# Patient Record
Sex: Female | Born: 1955 | Race: White | Hispanic: No | Smoking: Former smoker
Health system: Southern US, Community
[De-identification: ages and names within clinical notes are randomized; demographics above are authoritative.]

## PROBLEM LIST (undated history)

## (undated) DIAGNOSIS — M542 Cervicalgia: Secondary | ICD-10-CM

## (undated) DIAGNOSIS — J449 Chronic obstructive pulmonary disease, unspecified: Secondary | ICD-10-CM

## (undated) DIAGNOSIS — G8929 Other chronic pain: Secondary | ICD-10-CM

## (undated) DIAGNOSIS — E785 Hyperlipidemia, unspecified: Secondary | ICD-10-CM

## (undated) DIAGNOSIS — E079 Disorder of thyroid, unspecified: Secondary | ICD-10-CM

## (undated) HISTORY — PX: TUBAL LIGATION: SHX77

## (undated) HISTORY — PX: OTHER SURGICAL HISTORY: SHX169

## (undated) HISTORY — PX: BUNIONECTOMY: SHX129

---

## 2001-02-09 ENCOUNTER — Other Ambulatory Visit: Admission: RE | Admit: 2001-02-09 | Discharge: 2001-02-09 | Payer: Self-pay | Admitting: Family Medicine

## 2002-06-13 ENCOUNTER — Other Ambulatory Visit: Admission: RE | Admit: 2002-06-13 | Discharge: 2002-06-13 | Payer: Self-pay | Admitting: Family Medicine

## 2005-07-02 ENCOUNTER — Ambulatory Visit: Payer: Self-pay | Admitting: Family Medicine

## 2006-02-19 ENCOUNTER — Ambulatory Visit: Payer: Self-pay | Admitting: Family Medicine

## 2016-07-06 ENCOUNTER — Emergency Department (HOSPITAL_COMMUNITY): Payer: BLUE CROSS/BLUE SHIELD

## 2016-07-06 ENCOUNTER — Encounter (HOSPITAL_COMMUNITY): Payer: Self-pay

## 2016-07-06 ENCOUNTER — Observation Stay (HOSPITAL_COMMUNITY)
Admission: EM | Admit: 2016-07-06 | Discharge: 2016-07-07 | Disposition: A | Discharge status: Home / Self Care | Payer: BLUE CROSS/BLUE SHIELD | Attending: Internal Medicine | Admitting: Internal Medicine

## 2016-07-06 DIAGNOSIS — G459 Transient cerebral ischemic attack, unspecified: Secondary | ICD-10-CM | POA: Diagnosis not present

## 2016-07-06 DIAGNOSIS — Z7982 Long term (current) use of aspirin: Secondary | ICD-10-CM | POA: Diagnosis not present

## 2016-07-06 DIAGNOSIS — H5461 Unqualified visual loss, right eye, normal vision left eye: Secondary | ICD-10-CM | POA: Diagnosis present

## 2016-07-06 DIAGNOSIS — E039 Hypothyroidism, unspecified: Secondary | ICD-10-CM | POA: Insufficient documentation

## 2016-07-06 DIAGNOSIS — Z833 Family history of diabetes mellitus: Secondary | ICD-10-CM | POA: Insufficient documentation

## 2016-07-06 DIAGNOSIS — F17210 Nicotine dependence, cigarettes, uncomplicated: Secondary | ICD-10-CM | POA: Diagnosis not present

## 2016-07-06 DIAGNOSIS — E038 Other specified hypothyroidism: Secondary | ICD-10-CM | POA: Diagnosis not present

## 2016-07-06 DIAGNOSIS — J449 Chronic obstructive pulmonary disease, unspecified: Secondary | ICD-10-CM | POA: Diagnosis not present

## 2016-07-06 DIAGNOSIS — H5441 Blindness, right eye, normal vision left eye: Secondary | ICD-10-CM | POA: Insufficient documentation

## 2016-07-06 DIAGNOSIS — J438 Other emphysema: Secondary | ICD-10-CM | POA: Diagnosis not present

## 2016-07-06 DIAGNOSIS — Z823 Family history of stroke: Secondary | ICD-10-CM | POA: Diagnosis not present

## 2016-07-06 DIAGNOSIS — G453 Amaurosis fugax: Secondary | ICD-10-CM | POA: Diagnosis not present

## 2016-07-06 DIAGNOSIS — E785 Hyperlipidemia, unspecified: Secondary | ICD-10-CM | POA: Insufficient documentation

## 2016-07-06 HISTORY — DX: Cervicalgia: M54.2

## 2016-07-06 HISTORY — DX: Hyperlipidemia, unspecified: E78.5

## 2016-07-06 HISTORY — DX: Chronic obstructive pulmonary disease, unspecified: J44.9

## 2016-07-06 HISTORY — DX: Disorder of thyroid, unspecified: E07.9

## 2016-07-06 HISTORY — DX: Other chronic pain: G89.29

## 2016-07-06 LAB — CBC WITH DIFFERENTIAL/PLATELET
Basophils Absolute: 0 10*3/uL (ref 0.0–0.1)
Basophils Relative: 1 %
Eosinophils Absolute: 0.2 10*3/uL (ref 0.0–0.7)
Eosinophils Relative: 2 %
HCT: 46.3 % — ABNORMAL HIGH (ref 36.0–46.0)
Hemoglobin: 15 g/dL (ref 12.0–15.0)
Lymphocytes Relative: 28 %
Lymphs Abs: 1.9 10*3/uL (ref 0.7–4.0)
MCH: 31 pg (ref 26.0–34.0)
MCHC: 32.4 g/dL (ref 30.0–36.0)
MCV: 95.7 fL (ref 78.0–100.0)
Monocytes Absolute: 0.6 10*3/uL (ref 0.1–1.0)
Monocytes Relative: 8 %
Neutro Abs: 4 10*3/uL (ref 1.7–7.7)
Neutrophils Relative %: 61 %
Platelets: 277 10*3/uL (ref 150–400)
RBC: 4.84 MIL/uL (ref 3.87–5.11)
RDW: 12.7 % (ref 11.5–15.5)
WBC: 6.6 10*3/uL (ref 4.0–10.5)

## 2016-07-06 LAB — CBG MONITORING, ED: GLUCOSE-CAPILLARY: 125 mg/dL — AB (ref 65–99)

## 2016-07-06 LAB — BASIC METABOLIC PANEL
Anion gap: 8 (ref 5–15)
BUN: 7 mg/dL (ref 6–20)
CO2: 27 mmol/L (ref 22–32)
Calcium: 9.3 mg/dL (ref 8.9–10.3)
Chloride: 107 mmol/L (ref 101–111)
Creatinine, Ser: 0.58 mg/dL (ref 0.44–1.00)
GFR calc Af Amer: >60 mL/min (ref >60)
GFR calc non Af Amer: >60 mL/min (ref >60)
Glucose, Bld: 78 mg/dL (ref 65–99)
Potassium: 3.4 mmol/L — ABNORMAL LOW (ref 3.5–5.1)
Sodium: 142 mmol/L (ref 135–145)

## 2016-07-06 LAB — URINALYSIS, ROUTINE W REFLEX MICROSCOPIC
Bilirubin Urine: NEGATIVE
Glucose, UA: NEGATIVE mg/dL
Hgb urine dipstick: NEGATIVE
Ketones, ur: NEGATIVE mg/dL
Leukocytes, UA: NEGATIVE
Nitrite: NEGATIVE
Protein, ur: NEGATIVE mg/dL
Specific Gravity, Urine: 1.016 (ref 1.005–1.030)
pH: 6.5 (ref 5.0–8.0)

## 2016-07-06 MED ORDER — ONDANSETRON HCL 4 MG/2ML IJ SOLN: 4.0000 mg | Freq: Three times a day (TID) | INTRAMUSCULAR | Status: AC | PRN

## 2016-07-06 MED ORDER — MOMETASONE FURO-FORMOTEROL FUM 200-5 MCG/ACT IN AERO
2.0000 | INHALATION_SPRAY | Freq: Two times a day (BID) | RESPIRATORY_TRACT | Status: DC
  Administered 2016-07-07: 2 via RESPIRATORY_TRACT
  Filled 2016-07-06: qty 8.8

## 2016-07-06 MED ORDER — LORAZEPAM 2 MG/ML IJ SOLN
INTRAMUSCULAR | Status: AC
Start: 2016-07-06 — End: 2016-07-06
  Administered 2016-07-06: 1 mg
  Filled 2016-07-06: qty 1

## 2016-07-06 MED ORDER — SODIUM CHLORIDE 0.9% FLUSH
3.0000 mL | Freq: Two times a day (BID) | INTRAVENOUS | Status: DC
  Administered 2016-07-06: 3 mL via INTRAVENOUS

## 2016-07-06 MED ORDER — STROKE: EARLY STAGES OF RECOVERY BOOK
Freq: Once | Status: AC
  Administered 2016-07-06: 22:00:00

## 2016-07-06 MED ORDER — ENOXAPARIN SODIUM 40 MG/0.4ML ~~LOC~~ SOLN
40.0000 mg | Freq: Every day | SUBCUTANEOUS | Status: DC
  Administered 2016-07-06: 40 mg via SUBCUTANEOUS
  Filled 2016-07-06: qty 0.4

## 2016-07-06 MED ORDER — GADOBENATE DIMEGLUMINE 529 MG/ML IV SOLN
12.0000 mL | Freq: Once | INTRAVENOUS | Status: AC | PRN
  Administered 2016-07-06: 12 mL via INTRAVENOUS

## 2016-07-06 NOTE — ED Notes (Addendum)
Pt. Returned from MRI. Admitting provider at bedside.

## 2016-07-06 NOTE — ED Provider Notes (Signed)
[facility]-EMERGENCY DEPT Provider Note   CSN: [id number] Arrival date & time: 07/06/16  1305     History   Chief Complaint Chief Complaint  Patient presents with  . Neurologic Problem    HPI Nicole Hayes is a 60 y.o. female with history of COPD and 80-pack-year smoking history who presents following an episode of transient vision loss in her right eye. Patient states she was driving to a dental appointment this morning for extraction when she began seeing a curtain over her right eye. Patient states she began massaging her eye to try and improve her symptoms and she lost total vision in her right eye for 3-5 minutes. Around this time she also had a vague headache all over, which resolved. Patient arrived to her dentist appointment and her blood pressure was elevated to 143 systolic, which the patient's reports is very high for her. Prior to entering the dentist office, the patient took an aspirin and Valium prior to her anticipated dental extraction. Patient also reports she has been feeling off balance and lightheaded intermittently for the past few days. Patient states this has happened to her in the past when her fibroid medication was off. Patient also reports a "sandy," try feeling to her eyes for the past few days. Patient has been using drops OTC for her dry eyes. Patient also reports a bad episode of leg cramps last evening, however she experiences these on a regular basis. Patient believes it is due to her that his hearing diet and nutrient deficiency. Patient denies any chest pain, shortness of breath, abdominal pain, nausea, vomiting, dysuria, headache, dizziness, or lightheadedness at this time.  HPI  Past Medical History:  Diagnosis Date  . Chronic neck pain   . COPD (chronic obstructive pulmonary disease) (HCC)   . Hyperlipidemia   . Thyroid disease     Patient Active Problem List   Diagnosis Date Noted  . TIA (transient ischemic attack) 07/06/2016  . Vision loss of right  eye 07/06/2016    Past Surgical History:  Procedure Laterality Date  . BUNIONECTOMY     x2  . dental     implants  . TUBAL LIGATION      OB History    Gravida Para Term Preterm AB Living   4         3   SAB TAB Ectopic Multiple Live Births                   Home Medications    Prior to Admission medications   Medication Sig Start Date End Date Taking? Authorizing Provider  Fluticasone-Salmeterol (ADVAIR) 250-50 MCG/DOSE AEPB Inhale 1 puff into the lungs 2 (two) times daily.   Yes Historical Provider, MD  Multiple Vitamins-Minerals (ALIVE ONCE DAILY WOMENS PO) Take 1 tablet by mouth daily.   Yes Historical Provider, MD    Family History History reviewed. No pertinent family history.  Social History Social History  Substance Use Topics  . Smoking status: Former Smoker    Packs/day: 2.00    Years: 40.00    Types: Cigarettes  . Smokeless tobacco: Never Used     Comment: 2009 quit  . Alcohol use No     Allergies   Vitamin d2 [ergocalciferol] and Metformin and related   Review of Systems Review of Systems  Constitutional: Negative for chills and fever.  HENT: Negative for facial swelling.   Eyes: Positive for visual disturbance (resolved).  Respiratory: Negative for shortness of breath.  Cardiovascular: Negative for chest pain.  Gastrointestinal: Negative for abdominal pain, nausea and vomiting.  Genitourinary: Negative for dysuria.  Musculoskeletal: Negative for gait problem.  Skin: Negative for rash and wound.  Neurological: Positive for headaches (resolved).  Psychiatric/Behavioral: The patient is not nervous/anxious.      Physical Exam Updated Vital Signs BP 108/59 (BP Location: Left Arm)   Pulse 71   Temp 97.7 F (36.5 C)   Resp 17   SpO2 95%   Physical Exam  Constitutional: She appears well-developed and well-nourished. No distress.  HENT:  Head: Normocephalic and atraumatic.  Mouth/Throat: Oropharynx is clear and moist. No oropharyngeal  exudate.  Eyes: Conjunctivae and EOM are normal. Pupils are equal, round, and reactive to light. Right eye exhibits no discharge. Left eye exhibits no discharge. No scleral icterus.  Neck: Normal range of motion. Neck supple. No thyromegaly present.  Cardiovascular: Normal rate, regular rhythm and normal heart sounds.  Exam reveals no gallop and no friction rub.   No murmur heard. Pulmonary/Chest: Effort normal and breath sounds normal. No stridor. No respiratory distress. She has no wheezes. She has no rales.  Abdominal: Soft. Bowel sounds are normal. She exhibits no distension. There is no tenderness. There is no rebound and no guarding.  Musculoskeletal: She exhibits no edema.  Lymphadenopathy:    She has no cervical adenopathy.  Neurological: She is alert. Coordination normal.  CN 3-12 intact; normal sensation throughout; 5/5 strength in all 4 extremities; equal bilateral grip strength; no ataxia on finger to nose; no pronator drift   Skin: Skin is warm and dry. No rash noted. She is not diaphoretic. No pallor.  Psychiatric: She has a normal mood and affect.  Nursing note and vitals reviewed.    ED Treatments / Results  Labs (all labs ordered are listed, but only abnormal results are displayed) Labs Reviewed  BASIC METABOLIC PANEL - Abnormal; Notable for the following:       Result Value   Potassium 3.4 (*)    All other components within normal limits  CBC WITH DIFFERENTIAL/PLATELET - Abnormal; Notable for the following:    HCT 46.3 (*)    All other components within normal limits  CBG MONITORING, ED - Abnormal; Notable for the following:    Glucose-Capillary 125 (*)    All other components within normal limits  URINALYSIS, ROUTINE W REFLEX MICROSCOPIC (NOT [facility])    EKG  EKG Interpretation  Date/Time:  Monday July 06 2016 13:11:47 EDT Ventricular Rate:  76 PR Interval:    QRS Duration: 93 QT Interval:  390 QTC Calculation: 439 R Axis:   96 Text Interpretation:   Sinus rhythm Right axis deviation Borderline T abnormalities, inferior leads Confirmed by Lincoln Brighamees, Liz [phone]([id number]) on 07/06/2016 1:50:10 PM       Radiology No results found.  Procedures Procedures (including critical care time)  Medications Ordered in ED Medications  LORazepam (ATIVAN) 2 MG/ML injection (1 mg  Given 07/06/16 1737)  gadobenate dimeglumine (MULTIHANCE) injection 12 mL (12 mLs Intravenous Contrast Given 07/06/16 1916)     Initial Impression / Assessment and Plan / ED Course  I have reviewed the triage vital signs and the nursing notes.  Pertinent labs & imaging results that were available during my care of the patient were reviewed by me and considered in my medical decision making (see chart for details).  Clinical Course   CBC unremarkable. BMP shows potassium 3.4. UA negative. CBG 125. MR brain and MRA head and neck pending.  Patient evaluated by Felicie Morn, PA-C with neurology who would like a hospitalist admission for a stroke workup because of the patient's amaurosis fugax and TIA. Dr. Madilyn Hook spoke with Dr. Roda Shutters who will admit the patient to internal medicine for further evaluation and treatment. Patient also evaluated by Dr. Madilyn Hook who guided the patient's management and is in agreement with plan. Patient vitals stable throughout ED course.   Final Clinical Impressions(s) / ED Diagnoses   Final diagnoses:  Amaurosis fugax of right eye    New Prescriptions New Prescriptions   No medications on file     Verdis Prime 07/06/16 1919    Tilden Fossa, MD 07/08/16 1336

## 2016-07-06 NOTE — ED Notes (Signed)
Ordered diet tray(Reg Vegetarian)

## 2016-07-06 NOTE — ED Notes (Signed)
Gave pt coffee, crackers and peanut butter, per Erin-RN.

## 2016-07-06 NOTE — ED Notes (Signed)
Neuro at bedside.

## 2016-07-06 NOTE — Consult Note (Signed)
Requesting Physician: Dr. Pecola Leisure    Chief Complaint: amaurosis fugax  History obtained from:  Patient     HPI:                                                                                                                                         Nicole Hayes is an 60 y.o. female who quit smoking 6 years ago and has known hyperlipidemia. Patient states that she does not take aspirin although she does have aspirin interpersonal times. 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 She had no pain in that eye. She states she drove about 1-2 miles before the shade lifted. She denies any numbness, tingling, weakness throughout her face or other extremities. He denies any palpitations shortness of breath or sensation of dizziness. Currently her blood pressure is 98/83 however she states this is normal for her. Currently she is awaiting MRI and MRA of head and neck.  Date last known well: Date: 07/06/2016 Time last known well: Time: 09:00 tPA Given: No: symptoms resolved  Past Medical History:  Diagnosis Date  . Chronic neck pain   . COPD (chronic obstructive pulmonary disease) (HCC)   . Hyperlipidemia   . Thyroid disease     Past Surgical History:  Procedure Laterality Date  . BUNIONECTOMY     x2  . dental     implants  . TUBAL LIGATION      History reviewed. No pertinent family history. Social History:  reports that she has quit smoking. Her smoking use included Cigarettes. She has a 80.00 pack-year smoking history. She has never used smokeless tobacco. She reports that she does not drink alcohol or use drugs.  Allergies:  Allergies  Allergen Reactions  . Vitamin D2 [Ergocalciferol] Hives and Shortness Of Breath  . Metformin And Related Nausea And Vomiting    Medications:                                                                                                                            No current facility-administered medications for this encounter.    No current outpatient prescriptions on file.     ROS:  History obtained from the patient  General ROS: negative for - chills, fatigue, fever, night sweats, weight gain or weight loss Psychological ROS: negative for - behavioral disorder, hallucinations, memory difficulties, mood swings or suicidal ideation Ophthalmic ROS: negative for - blurry vision, double vision, eye pain or loss of vision ENT ROS: negative for - epistaxis, nasal discharge, oral lesions, sore throat, tinnitus or vertigo Allergy and Immunology ROS: negative for - hives or itchy/watery eyes Hematological and Lymphatic ROS: negative for - bleeding problems, bruising or swollen lymph nodes Endocrine ROS: negative for - galactorrhea, hair pattern changes, polydipsia/polyuria or temperature intolerance Respiratory ROS: negative for - cough, hemoptysis, shortness of breath or wheezing Cardiovascular ROS: negative for - chest pain, dyspnea on exertion, edema or irregular heartbeat Gastrointestinal ROS: negative for - abdominal pain, diarrhea, hematemesis, nausea/vomiting or stool incontinence Genito-Urinary ROS: negative for - dysuria, hematuria, incontinence or urinary frequency/urgency Musculoskeletal ROS: negative for - joint swelling or muscular weakness Neurological ROS: as noted in HPI Dermatological ROS: negative for rash and skin lesion changes  Neurologic Examination:                                                                                                      Blood pressure 98/83, pulse 78, temperature 97.7 F (36.5 C), resp. rate 24, SpO2 96 %.  HEENT-  Normocephalic, no lesions, without obvious abnormality.  Normal external eye and conjunctiva.   Normal TM's bilaterally.  Normal auditory canals and external ears. Normal external nose, mucus membranes and septum.  Normal pharynx. Cardiovascular- S1, S2 normal, pulses palpable throughout   Lungs- chest clear, no wheezing, rales, normal symmetric air entry Abdomen- normal findings: bowel sounds normal Extremities- no edema Lymph-no adenopathy palpable Musculoskeletal-no joint tenderness, deformity or swelling Skin-warm and dry, no hyperpigmentation, vitiligo, or suspicious lesions  Neurological Examination Mental Status: Alert, oriented, thought content appropriate.  Speech fluent without evidence of aphasia.  Able to follow 3 step commands without difficulty. Cranial Nerves: II: Visual fields grossly normal, pupils equal, round, reactive to light and accommodation, no ADP noted III,IV, VI: ptosis not present, extra-ocular motions intact bilaterally without nystagmus V,VII: smile symmetric, facial light touch sensation normal bilaterally VIII: hearing normal bilaterally IX,X: uvula rises symmetrically XI: bilateral shoulder shrug XII: midline tongue extension Motor: Right : Upper extremity   5/5    Left:     Upper extremity   5/5  Lower extremity   5/5     Lower extremity   5/5 Tone and bulk:normal tone throughout; no atrophy noted Sensory: Pinprick and light touch intact throughout, bilaterally Deep Tendon Reflexes: 2+ and symmetric throughout Plantars: Right: downgoing   Left: downgoing Cerebellar: normal finger-to-nose,  and normal heel-to-shin test Gait: Not tested       Lab Results: Basic Metabolic Panel:  Recent Labs Lab 07/06/16 1518  NA 142  K 3.4*  CL 107  CO2 27  GLUCOSE 78  BUN 7  CREATININE 0.58  CALCIUM 9.3    Liver Function Tests: No results for input(s): AST, ALT, ALKPHOS, BILITOT, PROT, ALBUMIN in the last 168  hours. No results for input(s): LIPASE, AMYLASE in the last 168 hours. No results for input(s): AMMONIA in the last 168  hours.  CBC:  Recent Labs Lab 07/06/16 1518  WBC 6.6  NEUTROABS 4.0  HGB 15.0  HCT 46.3*  MCV 95.7  PLT 277    Cardiac Enzymes: No results for input(s): CKTOTAL, CKMB, CKMBINDEX, TROPONINI in the last 168 hours.  Lipid Panel: No results for input(s): CHOL, TRIG, HDL, CHOLHDL, VLDL, LDLCALC in the last 168 hours.  CBG:  Recent Labs Lab 07/06/16 1314  GLUCAP 125*    Microbiology: No results found for this or any previous visit.  Coagulation Studies: No results for input(s): LABPROT, INR in the last 72 hours.  Imaging: No results found.     Assessment and plan discussed with with attending physician and they are in agreement.    Nicole Morn PA-C [facility] [phone]  07/06/2016, 4:22 PM   Assessment: 60 y.o. female with history of hyperlipidemia along with recently quitting smoking. Patient presents with transient loss of vision in the right eye very consistent with amaurosis fugax.  Given her risk factors patient would benefit from stroke workup.    Stroke Risk Factors - hyperlipidemia and previous smoker   1. HgbA1c, fasting lipid panel 2. MRI, MRA  of the brain without contrast 3. PT consult, OT consult, Speech consult 4. Echocardiogram 5. Carotid dopplers 6. Prophylactic therapy-Antiplatelet med: Aspirin - dose 81 mg daily 7. Risk factor modification 8. Telemetry monitoring 9. Frequent neuro checks 10 NPO until passes stroke swallow screen 11 please page stroke NP  Or  PA  Or MD from 8am -4 pm  as this patient from this time will be  followed by the stroke.   You can look them up on [email or web address]  [facility]    Neurology Attending Addendum  This patient was independently seen and examined. I have reviewed the PA note and agree with the findings, assessment and plan as documented with the following additions.   In brief, this is a 60 year old right-handed woman who developed transient vision loss in the right eye while driving  earlier today. This lasted 1-2 minutes and then completely resolved. She also reports a mild headache at the same time. She denies any other deficits such as weakness, numbness, double vision, speech changes, difficulty swallowing, vertigo. She has never had vision loss like this before. However, she has noticed for the past few months occasional episodes where she will have sensitivity to lights when driving at night. This did not occur today. She also suffered a recent corneal abrasion in the right eye. She does not take aspirin on a regular basis. She reports a history of hyperlipidemia, no other known risk factors. However, her sister suffered a stroke at the age of 98 or 22. The patient reports that her sister is also diabetic with peripheral arterial disease and longstanding tobacco abuse. There is no family or personal history of abnormal blood clots.  Physical exam: Her elemental neurologic examination is normal.  Diagnostic studies: I have personally and independently reviewed the MRI scan of the brain without contrast from 07/06/16. There are scattered patchy T2/FLAIR hyperintensities in the bihemispheric white matter and the pons that are most consistent with chronic small vessel ischemic change. No restricted diffusion is present to suggest acute ischemia. No other acute normalities are appreciated.  I have personally and independently reviewed the MRA scan of the head. This appears to be normal.  I have personally and independently  reviewed the MRA scan of the neck. She has a dominant right vertebral artery with mild irregularity of both vertebral arteries noted. Per radiologist report, these are suggestive of mild atherosclerotic change versus extrinsic deformity such as that which could be seen with degenerative cervical spine disease.   Impression:  1. Possible TIA: History is reported is concerning for amaurosis fugax, consistent with transient retinal ischemia. No pathology is seen in  the carotid arteries or intracranial vessels. Her only known risk factors for cerebrovascular disease are hyperlipidemia and an early family history of stroke in her sister. Further workup is pending including echocardiogram, hemoglobin A1c, and a fasting lipid panel. Recommend aspirin 81 mg daily for secondary prevention. She will likely also benefit from the addition of a statin once fasting lipids have been evaluated.  This was discussed with the patient and she is in agreement with the plan as stated. She was given the opportunity to ask any questions and these are addressed to her satisfaction. Thank you for the opportunity of this patient's patient's care. Please for free to call any questions or concerns.  The patient will be followed by the stroke team evening with morning rounds on 07/07/16.

## 2016-07-06 NOTE — ED Triage Notes (Signed)
Pt. Coming from urgent care via [facility] for an episode of vision loss lasting 3-5 min in right eye. Pt. Driving to dentist for tooth extraction during this episode. Pt. Arrived at dentist, tooth valium and 324 ASA, and got her blood pressure checked. Pt. Reports BP systolic 140 and that is highly abnormal for her. Pt. Reports usual is 100 systolic. Pt. Hx of high cholesterol, thyroid disease, and COPD. Pt. Aox4 with no complaints at this time.

## 2016-07-06 NOTE — H&P (Signed)
History and Physical  Nicole BurnCosby J Mifflin ZOX:[id number] DOB: Aug 08, 1956 DOA: 07/06/2016  Referring physician: EDP PCP: Pcp Not In System   Chief Complaint:  Right eye Loss of vision   HPI: Nicole Hayes is a 60 y.o. female  With h/ o high cholesterol, thyroid disease, and COPD, current Smoker who does not take any medicine on regular basis including asa. Today she was driving to her dentist for tooth extraction, she developed vision loss to her right eye which she described as there was a veil coming down over her right eye leading to complete blindness in the right eye, symptom lasted about 3-5 mins, she also report some vage headache with the vision changes, but no eye pain, she has been feeling off balance and lightheadedness intermittently for the pastfew days, she has chronic intermittent leg cramps, she took valium and 324 asa,EMS called,  systolic blood pressure around 140, [facility].   ED course: her vital is stable, she is asymptomatic upon arrival, basic labs with normal wbc, mildly elevated hgb/hct at 15/46%, k 3.4, cr 0.58, ua unremarkable, ekg with NSR, right axis deviation,no acute st/t changes,  QTc wnl, neurology consulted who recommended stroke work up and admit to hospitalist service  She has no fever, no chest pain, no cough, no sob, no palpitation, no constipation, no diarrhea, no dysuria, no lower extremity edem   Review of Systems:  Detail per HPI, Review of systems are otherwise negative  Past Medical History:  Diagnosis Date  . Chronic neck pain   . COPD (chronic obstructive pulmonary disease) (HCC)   . Hyperlipidemia   . Thyroid disease    Past Surgical History:  Procedure Laterality Date  . BUNIONECTOMY     x2  . dental     implants  . TUBAL LIGATION     Social History:  reports that she has quit smoking. Her smoking use included Cigarettes. She has a 80.00 pack-year smoking history. She has never used smokeless tobacco. She reports  that she does not drink alcohol or use drugs. Patient lives at home & is able to participate in activities of daily living independently   Allergies  Allergen Reactions  . Vitamin D2 [Ergocalciferol] Hives and Shortness Of Breath  . Metformin And Related Nausea And Vomiting    History reviewed. No pertinent family history.    Prior to Admission medications   Not on File    Physical Exam: BP 108/59 (BP Location: Left Arm)   Pulse 71   Temp 97.7 F (36.5 C)   Resp 17   SpO2 95%   General:  NAD Eyes: PERRL ENT: unremarkable Neck: supple, no JVD Cardiovascular: RRR Respiratory: CTABL Abdomen: soft/ND/ND, positive bowel sounds Skin: no rash Musculoskeletal:  No edema Psychiatric: calm/cooperative Neurologic: no focal findings            Labs on Admission:  Basic Metabolic Panel:  Recent Labs Lab 07/06/16 1518  NA 142  K 3.4*  CL 107  CO2 27  GLUCOSE 78  BUN 7  CREATININE 0.58  CALCIUM 9.3   Liver Function Tests: No results for input(s): AST, ALT, ALKPHOS, BILITOT, PROT, ALBUMIN in the last 168 hours. No results for input(s): LIPASE, AMYLASE in the last 168 hours. No results for input(s): AMMONIA in the last 168 hours. CBC:  Recent Labs Lab 07/06/16 1518  WBC 6.6  NEUTROABS 4.0  HGB 15.0  HCT 46.3*  MCV 95.7  PLT 277   Cardiac  Enzymes: No results for input(s): CKTOTAL, CKMB, CKMBINDEX, TROPONINI in the last 168 hours.  BNP (last 3 results) No results for input(s): BNP in the last 8760 hours.  ProBNP (last 3 results) No results for input(s): PROBNP in the last 8760 hours.  CBG:  Recent Labs Lab 07/06/16 1314  GLUCAP 125*    Radiological Exams on Admission: No results found.    Assessment/Plan Present on Admission: **None**  Right eye vision loss concerning for amaurosis fugax, stroke/TIA focused order set utilized, neurology consulted stroke work up in progress, will follow neurology rec's. Admit to med tele/obs  Smoker: prior  heavy smoking, report quit in 2009  H/o copd/HLD/thyroid disease: report not taking meds on regular basis, stable, check lipid panel and tsh    DVT prophylaxis: lovenox  Consultants: neurology  Code Status: full   Family Communication:  Patient   Disposition Plan: admit to med tele  Time spent: 60mins  Sorah Falkenstein MD, PhD [facility] Pager [phone] If 7PM-7AM, please contact night-coverage at [email or web address], password [email or web address]

## 2016-07-06 NOTE — ED Notes (Signed)
Pt. Transported to cT at this time.  

## 2016-07-06 NOTE — ED Notes (Signed)
Pt. Sleeping and oxygen saturation dropping to 80's. Pt. Placed on oxygen 2L nasal canula by RN. Pt. Still very sleepy from ativan at this time.

## 2016-07-06 NOTE — ED Notes (Signed)
Patient transported to MRI 

## 2016-07-06 NOTE — ED Notes (Signed)
Pt. passed swallowing screen , NIH score =0 , denies pain / respirations unlabored , iv site intact , no vision loss at this time .

## 2016-07-06 NOTE — ED Notes (Signed)
RN went to MRI to give medication because patient very claustrophobic.

## 2016-07-06 NOTE — ED Notes (Signed)
Pt. Transported to MRI at this time.  

## 2016-07-06 NOTE — ED Notes (Signed)
Pt. Ambulatory from stretcher to bed with steady gait.

## 2016-07-06 NOTE — ED Notes (Signed)
EDP at bedside  

## 2016-07-07 ENCOUNTER — Observation Stay (HOSPITAL_BASED_OUTPATIENT_CLINIC_OR_DEPARTMENT_OTHER): Payer: BLUE CROSS/BLUE SHIELD

## 2016-07-07 DIAGNOSIS — E038 Other specified hypothyroidism: Secondary | ICD-10-CM | POA: Diagnosis not present

## 2016-07-07 DIAGNOSIS — G459 Transient cerebral ischemic attack, unspecified: Secondary | ICD-10-CM | POA: Diagnosis not present

## 2016-07-07 DIAGNOSIS — G453 Amaurosis fugax: Secondary | ICD-10-CM | POA: Diagnosis not present

## 2016-07-07 DIAGNOSIS — E785 Hyperlipidemia, unspecified: Secondary | ICD-10-CM | POA: Diagnosis not present

## 2016-07-07 LAB — RAPID URINE DRUG SCREEN, HOSP PERFORMED
Amphetamines: NOT DETECTED
BARBITURATES: NOT DETECTED
Benzodiazepines: POSITIVE — AB
Cocaine: NOT DETECTED
Opiates: NOT DETECTED
Tetrahydrocannabinol: NOT DETECTED

## 2016-07-07 LAB — COMPREHENSIVE METABOLIC PANEL
ALK PHOS: 58 U/L (ref 38–126)
ALT: 23 U/L (ref 14–54)
AST: 22 U/L (ref 15–41)
Albumin: 4.2 g/dL (ref 3.5–5.0)
Anion gap: 7 (ref 5–15)
BILIRUBIN TOTAL: 0.7 mg/dL (ref 0.3–1.2)
BUN: 7 mg/dL (ref 6–20)
CALCIUM: 9.2 mg/dL (ref 8.9–10.3)
CO2: 26 mmol/L (ref 22–32)
Chloride: 106 mmol/L (ref 101–111)
Creatinine, Ser: 0.67 mg/dL (ref 0.44–1.00)
Glucose, Bld: 146 mg/dL — ABNORMAL HIGH (ref 65–99)
POTASSIUM: 3.7 mmol/L (ref 3.5–5.1)
Sodium: 139 mmol/L (ref 135–145)
TOTAL PROTEIN: 7.1 g/dL (ref 6.5–8.1)

## 2016-07-07 LAB — SEDIMENTATION RATE: SED RATE: 4 mm/h (ref 0–22)

## 2016-07-07 LAB — TSH: TSH: 1.704 u[IU]/mL (ref 0.350–4.500)

## 2016-07-07 LAB — HIV ANTIBODY (ROUTINE TESTING W REFLEX): HIV SCREEN 4TH GENERATION: NONREACTIVE

## 2016-07-07 LAB — CBC
HEMATOCRIT: 48 % — AB (ref 36.0–46.0)
HEMOGLOBIN: 15.7 g/dL — AB (ref 12.0–15.0)
MCH: 31.3 pg (ref 26.0–34.0)
MCHC: 32.7 g/dL (ref 30.0–36.0)
MCV: 95.6 fL (ref 78.0–100.0)
Platelets: 255 10*3/uL (ref 150–400)
RBC: 5.02 MIL/uL (ref 3.87–5.11)
RDW: 12.6 % (ref 11.5–15.5)
WBC: 5.5 10*3/uL (ref 4.0–10.5)

## 2016-07-07 LAB — PROTIME-INR
INR: 1.07
PROTHROMBIN TIME: 14 s (ref 11.4–15.2)

## 2016-07-07 LAB — ECHOCARDIOGRAM COMPLETE
HEIGHTINCHES: 67 in
WEIGHTICAEL: 2000 [oz_av]

## 2016-07-07 LAB — LIPID PANEL
Cholesterol: 237 mg/dL — ABNORMAL HIGH (ref 0–200)
HDL: 71 mg/dL (ref >40)
LDL Cholesterol: 144 mg/dL — ABNORMAL HIGH (ref 0–99)
TRIGLYCERIDES: 111 mg/dL (ref <150)
Total CHOL/HDL Ratio: 3.3 RATIO
VLDL: 22 mg/dL (ref 0–40)

## 2016-07-07 LAB — VITAMIN B12: VITAMIN B 12: 286 pg/mL (ref 180–914)

## 2016-07-07 LAB — C-REACTIVE PROTEIN

## 2016-07-07 LAB — RPR: RPR: NONREACTIVE

## 2016-07-07 MED ORDER — ASPIRIN 325 MG PO TBEC: 325.0000 mg | DELAYED_RELEASE_TABLET | Freq: Every day | ORAL | 0 refills | Status: DC

## 2016-07-07 MED ORDER — EZETIMIBE 10 MG PO TABS: 10.0000 mg | ORAL_TABLET | Freq: Every day | ORAL | 0 refills | Status: DC

## 2016-07-07 MED ORDER — ATORVASTATIN CALCIUM 20 MG PO TABS: 20.0000 mg | ORAL_TABLET | Freq: Every day | ORAL | 0 refills | Status: DC

## 2016-07-07 MED ORDER — ASPIRIN 325 MG PO TBEC: 325.0000 mg | DELAYED_RELEASE_TABLET | Freq: Every day | ORAL | 0 refills | Status: AC

## 2016-07-07 MED ORDER — EZETIMIBE 10 MG PO TABS: 10.0000 mg | ORAL_TABLET | Freq: Every day | ORAL | 0 refills | Status: AC

## 2016-07-07 MED ORDER — ATORVASTATIN CALCIUM 10 MG PO TABS: 20.0000 mg | ORAL_TABLET | Freq: Every day | ORAL | Status: DC

## 2016-07-07 MED ORDER — ASPIRIN EC 325 MG PO TBEC
325.0000 mg | DELAYED_RELEASE_TABLET | Freq: Every day | ORAL | Status: DC
  Administered 2016-07-07: 325 mg via ORAL
  Filled 2016-07-07: qty 1

## 2016-07-07 NOTE — Care Management Note (Signed)
Case Management Note  Patient Details  Name: Nicole Hayes MRN: [id number] Date of Birth: 05/13/1956  Subjective/Objective:   Pt in with TIA and negative MRI. She is from home with her spouse.                  Action/Plan: Pt discharging home with self care. No further needs per CM.   Expected Discharge Date:                  Expected Discharge Plan:  Home/Self Care  In-House Referral:     Discharge planning Services     Post Acute Care Choice:    Choice offered to:     DME Arranged:    DME Agency:     HH Arranged:    HH Agency:     Status of Service:  Completed, signed off  If discussed at [employer] Length [employer] Stay Meetings, dates discussed:    Additional Comments:  Kermit BaloKelli F Tanishka Drolet, RN 07/07/2016, 3:12 PM

## 2016-07-07 NOTE — Progress Notes (Signed)
Pt discharged home with family. Discharge information given. IV and telemetry discontinued. Pt questions asked and answered. Pt to leave unit via wheelchair. Nicole RadarHeather M Jenniferann Hayes

## 2016-07-07 NOTE — Progress Notes (Signed)
  Echocardiogram 2D Echocardiogram has been performed.  Tye SavoyCasey N Steffi Noviello 07/07/2016, 3:27 PM

## 2016-07-07 NOTE — Progress Notes (Signed)
STROKE TEAM PROGRESS NOTE   HISTORY OF PRESENT ILLNESS (per record) Nicole Hayes is an 60 y.o. female who quit smoking 6 years ago and has known hyperlipidemia. Patient states that she does not take aspirin although she does have aspirin some times. 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 She had no pain in that eye. She states she drove about 1-2 miles before the shade lifted. She denies any numbness, tingling, weakness throughout her face or other extremities. He denies any palpitations shortness of breath or sensation of dizziness. Currently her blood pressure is 98/83 however she states this is normal for her. Currently she is awaiting MRI and MRA of head and neck. She was last known well 07/06/2016 at 09:00. Patient was not administered IV t-PA secondary to symptoms resolved. She was admitted for further evaluation and treatment.   SUBJECTIVE (INTERVAL HISTORY) Husband at bedside. She stated that she was driving and experienced "curtain down" at right eye and then became blind for 5 min and then recovered. 30-33mn later, had dull HA, all over the head, lasted one hour, no N/V. NO hx of migraine or vision loss.    OBJECTIVE Temp:  [97.6 F (36.4 C)-98.7 F (37.1 C)] 98.4 F (36.9 C) (08/15 1332) Pulse Rate:  [60-92] 92 (08/15 1332) Cardiac Rhythm: Normal sinus rhythm (08/15 0701) Resp:  [16-24] 20 (08/15 1332) BP: (84-109)/(45-91) 91/46 (08/15 1332) SpO2:  [93 %-99 %] 97 % (08/15 1332) Weight:  [56.7 kg (125 lb)] 56.7 kg (125 lb) (08/14 2125)  CBC:  Recent Labs Lab 07/06/16 1518 07/07/16 0845  WBC 6.6 5.5  NEUTROABS 4.0  --   HGB 15.0 15.7*  HCT 46.3* 48.0*  MCV 95.7 95.6  PLT 277 2353   Basic Metabolic Panel:  Recent Labs Lab 07/06/16 1518 07/07/16 0845  NA 142 139  K 3.4* 3.7  CL 107 106  CO2 27 26  GLUCOSE  78 146*  BUN 7 7  CREATININE 0.58 0.67  CALCIUM 9.3 9.2    Lipid Panel:    Component Value Date/Time   CHOL 237 (H) 07/07/2016 0845   TRIG 111 07/07/2016 0845   HDL 71 07/07/2016 0845   CHOLHDL 3.3 07/07/2016 0845   VLDL 22 07/07/2016 0845   LDLCALC 144 (H) 07/07/2016 0845   HgbA1c: No results found for: HGBA1C Urine Drug Screen:    Component Value Date/Time   LABOPIA NONE DETECTED 07/07/2016 0740   COCAINSCRNUR NONE DETECTED 07/07/2016 0740   LABBENZ POSITIVE (A) 07/07/2016 0740   AMPHETMU NONE DETECTED 07/07/2016 0740   THCU NONE DETECTED 07/07/2016 0740   LABBARB NONE DETECTED 07/07/2016 0740      IMAGING I have personally reviewed the radiological images below and agree with the radiology interpretations.  MRI HEAD 07/06/2016 No acute intracranial process, specifically no acute ischemia. Mild to moderate white matter changes compatible with chronic small vessel ischemic disease.   MRA HEAD 07/06/2016 Negative.   MRA NECK 07/06/2016 No hemodynamically significant stenosis. Mild luminal irregularity of the vertebral arteries suggests mild atherosclerosis or extrinsic deformity (degenerative cervical spine).    2D ehco - Left ventricle: The cavity size was normal. Wall thickness was   normal. Systolic function was vigorous. The estimated ejection   fraction was in the range of 65% to 70%. Wall motion was normal;  there were no regional wall motion abnormalities. Doppler   parameters are consistent with abnormal left ventricular   relaxation (grade 1 diastolic dysfunction). Impressions: - No cardiac source of emboli was indentified.   PHYSICAL EXAM General - Well nourished, well developed, in no apparent distress.  Ophthalmologic - Fundi not visualized.  Cardiovascular - Regular rate and rhythm.  Mental Status -  Level of arousal and orientation to time, place, and person were intact. Language including expression, naming, repetition, comprehension was assessed  and found intact. Attention span and concentration were normal. Recent and remote memory were intact. Fund of Knowledge was assessed and was intact.  Cranial Nerves II - XII - II - Visual field intact OU. III, IV, VI - Extraocular movements intact. V - Facial sensation intact bilaterally. VII - Facial movement intact bilaterally. VIII - Hearing & vestibular intact bilaterally. X - Palate elevates symmetrically. XI - Chin turning & shoulder shrug intact bilaterally. XII - Tongue protrusion intact.  Motor Strength - The patient's strength was normal in all extremities and pronator drift was absent.  Bulk was normal and fasciculations were absent.   Motor Tone - Muscle tone was assessed at the neck and appendages and was normal.  Reflexes - The patient's reflexes were 1+ in all extremities and she had no pathological reflexes.  Sensory - Light touch, temperature/pinprick, vibration and proprioception, and Romberg testing were assessed and were symmetrical.    Coordination - The patient had normal movements in the hands and feet with no ataxia or dysmetria.  Tremor was absent.  Gait and Station - The patient's transfers, posture, gait, station, and turns were observed as normal.    ASSESSMENT/PLAN Ms. Nicole Hayes is a 60 y.o. female with history of high cholesterol, thyroid disease, and COPD, current Smoker presenting with R amaurosis fugax. She did not receive IV t-PA due to symptoms resolved.   TIA vs. Occular migraine vs. Retinal disease  MRI  No acute stroke  MRA head  Negative  MRA neck no significant stenosis  2D Echo  EF 65-70%  LDL 144  ANA pending, TSH, CRP, ESR, HIV, RPR and VB12 normal - TA ruled out  HgbA1c pending  Lovenox 40 mg sq daily for VTE prophylaxis  Diet Heart Room service appropriate? Yes; Fluid consistency: Thin  No antithrombotic prior to admission, now on aspirin 325 mg daily. Continue ASA on discharge.  Patient counseled to be compliant  with her antithrombotic medications  Ongoing aggressive stroke risk factor management  Therapy recommendations:  No therapy needs  Disposition:  Return home  Recommend to also follow up with ophthalmology   Hyperlipidemia  Home meds:  No statin  LDL 144, goal < 100  Add statin lipitor 5m   Continue statin at discharge  Other Stroke Risk Factors  Former cigarette smoker  Hospital day # 0  Neurology will sign off. Please call with questions. [facility] Thanks for the consult.  JRosalin Hawking MD PhD Stroke Neurology 07/08/2016 12:08 AM   To contact Stroke Continuity provider, please refer to [email or web address] After hours, contact General Neurology
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
## 2016-07-08 ENCOUNTER — Other Ambulatory Visit: Payer: Self-pay | Admitting: Neurology

## 2016-07-08 DIAGNOSIS — E785 Hyperlipidemia, unspecified: Secondary | ICD-10-CM

## 2016-07-08 DIAGNOSIS — G453 Amaurosis fugax: Secondary | ICD-10-CM

## 2016-07-08 LAB — HEMOGLOBIN A1C
Hgb A1c MFr Bld: 5.8 % — ABNORMAL HIGH (ref 4.8–5.6)
MEAN PLASMA GLUCOSE: 120 mg/dL

## 2016-07-08 MED ORDER — ATORVASTATIN CALCIUM 40 MG PO TABS: 40.0000 mg | ORAL_TABLET | Freq: Every day | ORAL | 3 refills | Status: AC

## 2016-07-09 LAB — FANA STAINING PATTERNS: Homogeneous Pattern: 1:80 {titer}

## 2016-07-09 LAB — ANTINUCLEAR ANTIBODIES, IFA: ANA Ab, IFA: POSITIVE — AB
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
